# Patient Record
Sex: Male | Born: 1995 | Race: White | Hispanic: No | Marital: Single | Smoking: Never smoker
Health system: Southern US, Community
[De-identification: ages and names within clinical notes are randomized; demographics above are authoritative.]

## PROBLEM LIST (undated history)

## (undated) HISTORY — PX: APPENDECTOMY: SHX54

---
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
## 2016-05-14 IMAGING — DX DG ANKLE COMPLETE 3+V*R*
3 series · 3 of 3 positions shown · non-contrast
Comparison: None.

CLINICAL DATA: Hurt right foot and ankle while playing basketball
today. Initial encounter.

EXAM:
RIGHT ANKLE - COMPLETE 3+ VIEW

[ankle ap]
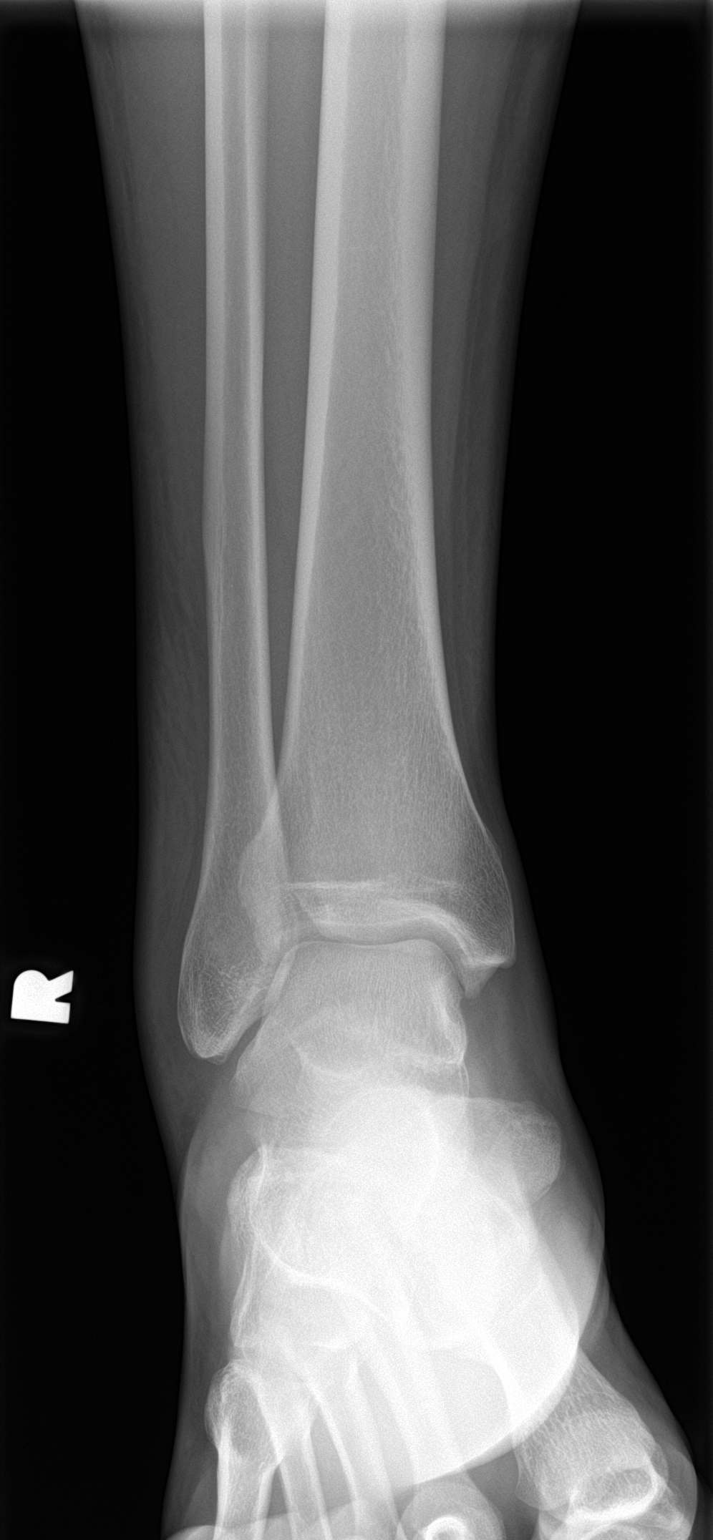

[ankle obl]
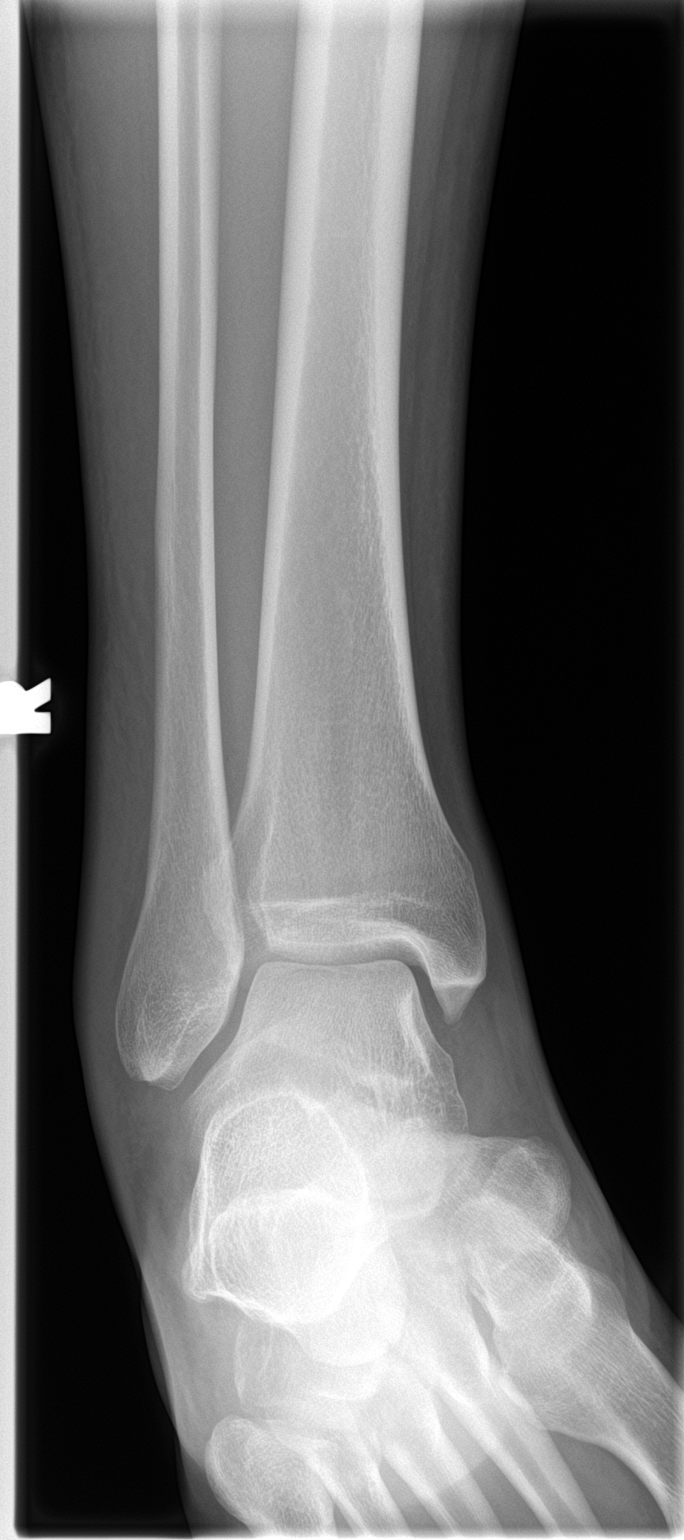

[ankle lat]
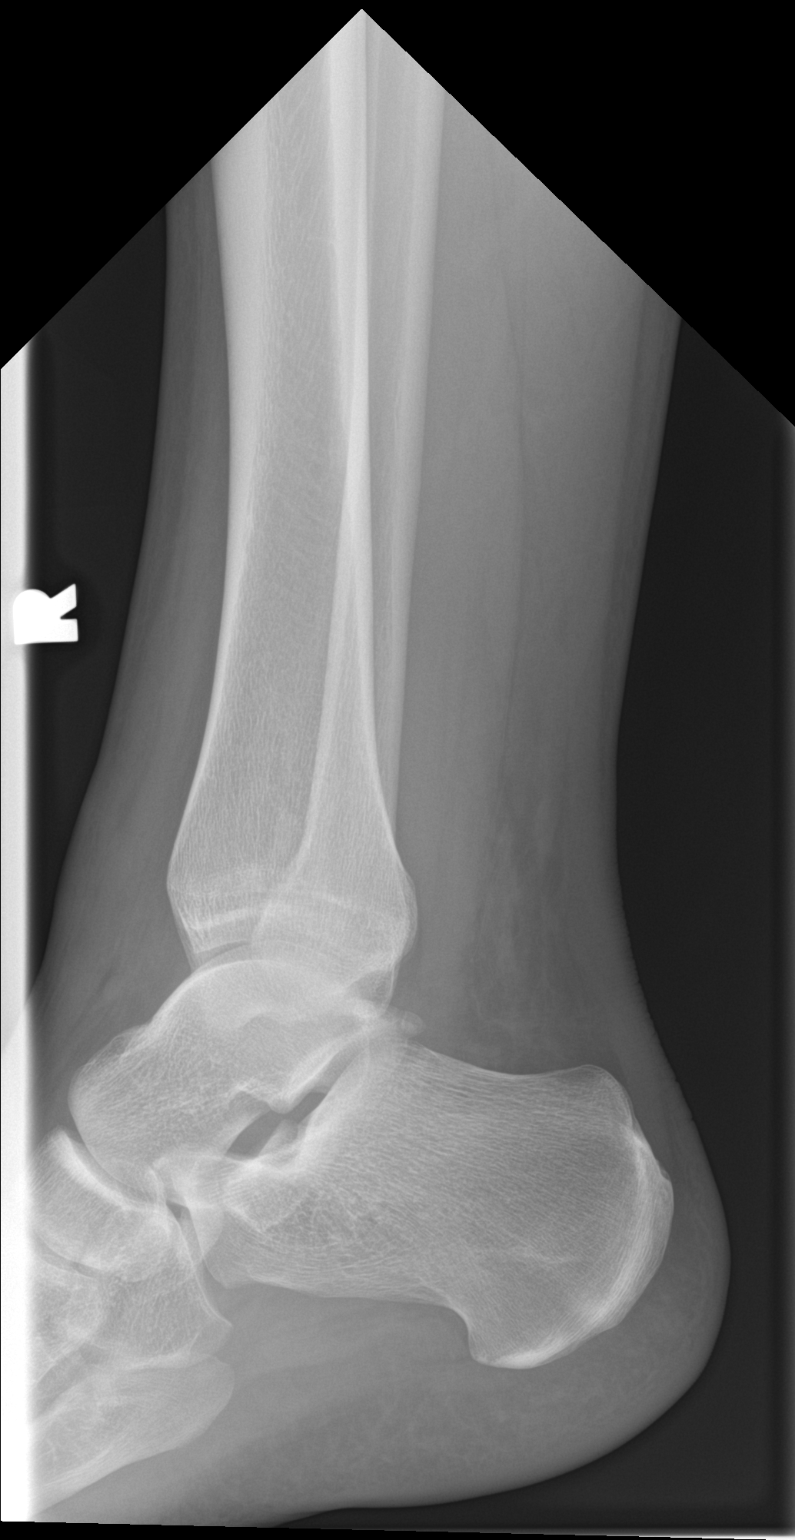

[3 of 3 positions shown; findings below may reference images not displayed]

FINDINGS: Soft tissue swelling about the lateral ankle. There is no evidence
of fracture, dislocation, or joint effusion.
IMPRESSION: Soft tissue swelling without bony abnormality.

## 2016-05-14 IMAGING — DX DG FOOT COMPLETE 3+V*R*
3 series · 3 of 3 positions shown · non-contrast
Comparison: None.

CLINICAL DATA: Hurt right foot playing basketball this afternoon.
Twisted the ankle.

EXAM:
RIGHT FOOT COMPLETE - 3+ VIEW

[foot ap]
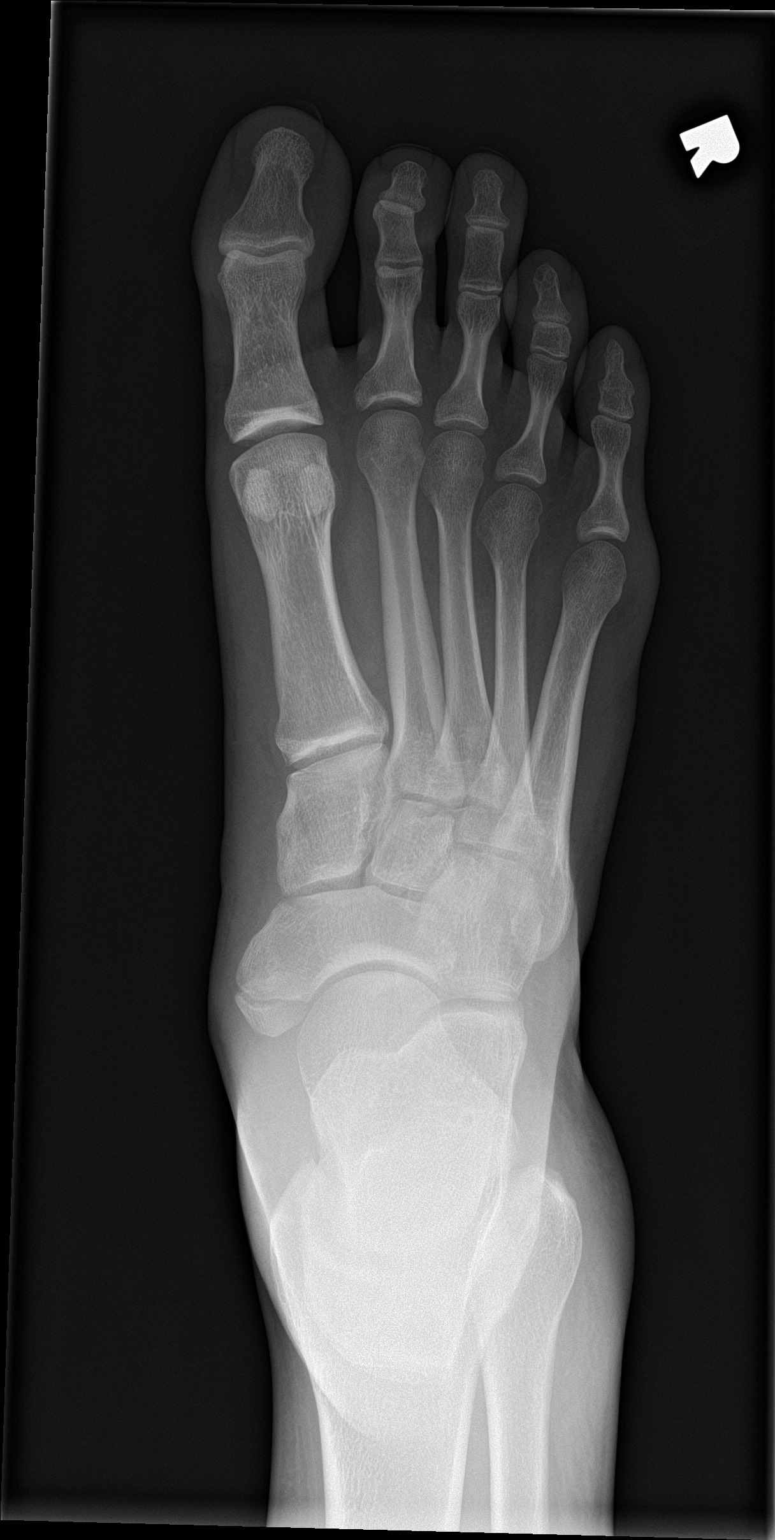

[foot obl]
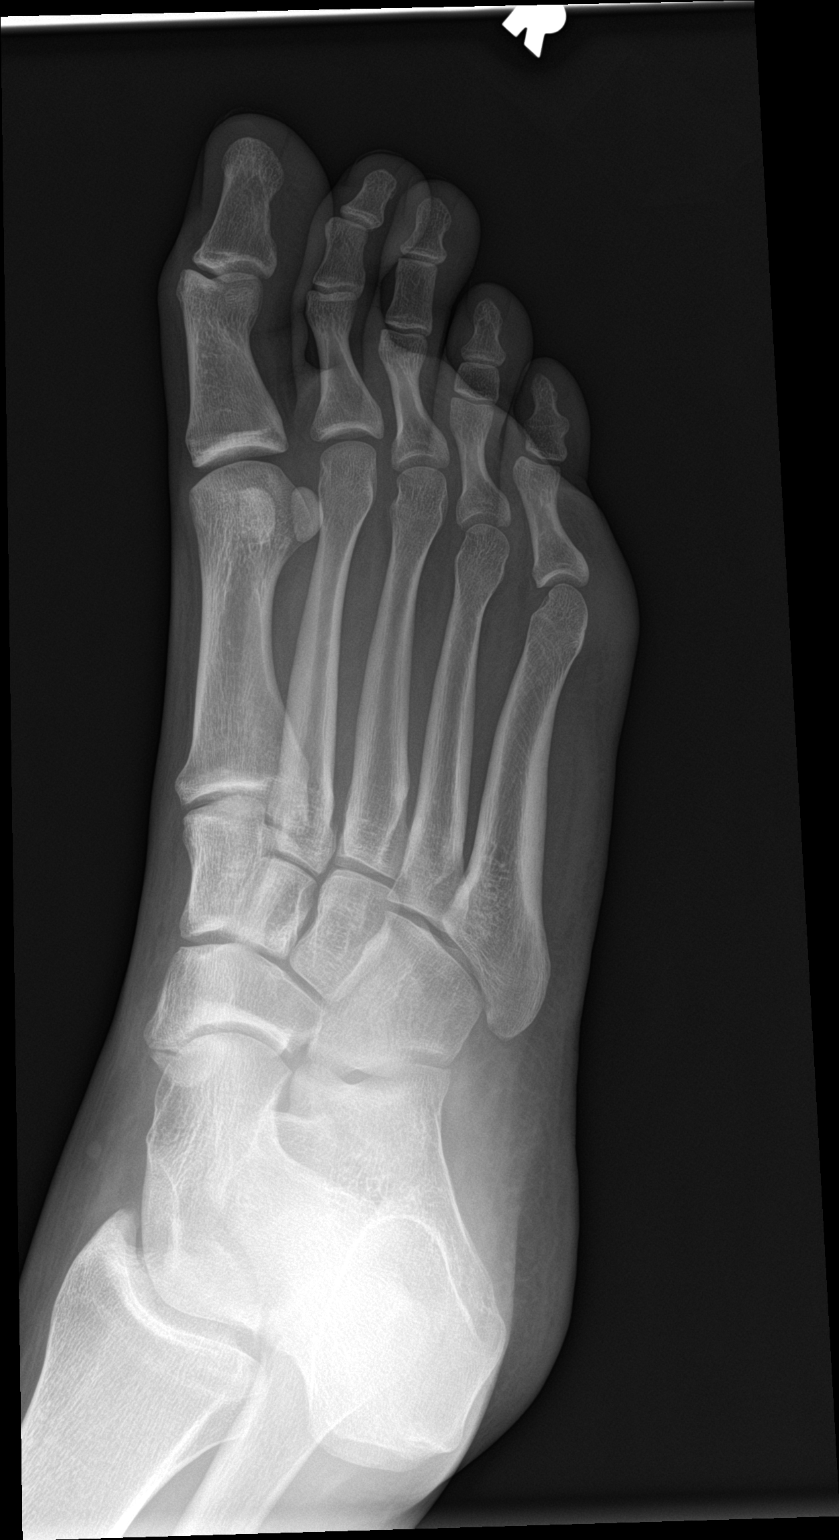

[foot lat]
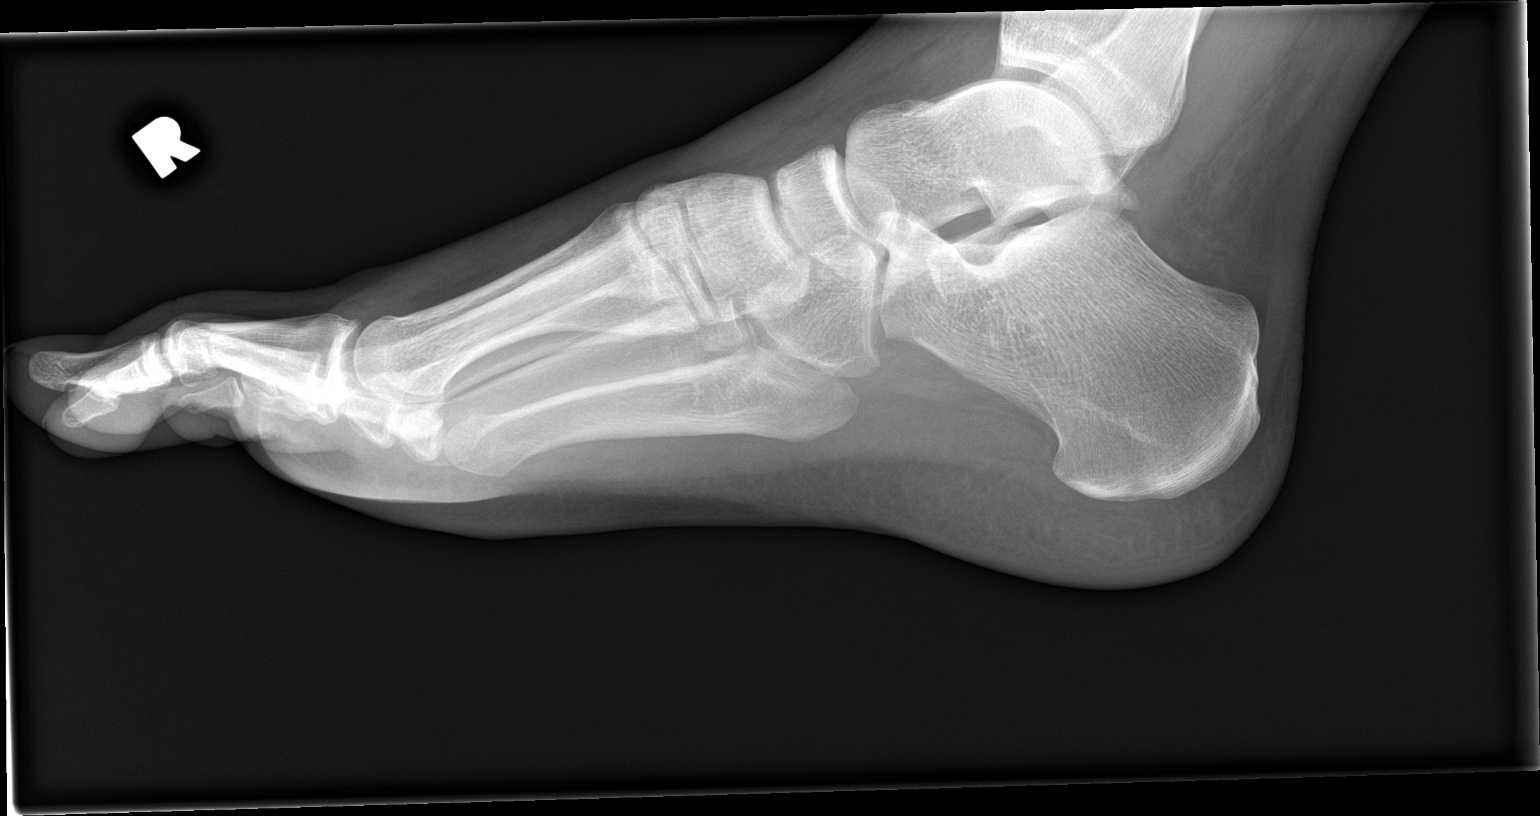

[3 of 3 positions shown; findings below may reference images not displayed]

FINDINGS: Soft tissue swelling about the lateral aspect of the right foot and
ankle. Old ununited ossicle over the navicular bone. No evidence of
acute fracture or subluxation. No focal bone lesion or bone
destruction. Bone cortex and trabecular architecture appear intact.
No radiopaque soft tissue foreign bodies.
IMPRESSION: Soft tissue swelling.  No acute bony abnormalities.

## 2018-03-31 ENCOUNTER — Ambulatory Visit: Payer: Medicaid Other | Admitting: Family Medicine

## 2018-04-05 ENCOUNTER — Ambulatory Visit: Payer: Medicaid Other | Admitting: Family Medicine

## 2018-07-19 ENCOUNTER — Ambulatory Visit: Payer: Medicaid Other | Admitting: Internal Medicine

## 2018-08-01 ENCOUNTER — Ambulatory Visit: Payer: Medicaid Other | Admitting: Internal Medicine

## 2018-08-08 ENCOUNTER — Ambulatory Visit: Payer: Medicaid Other | Admitting: Internal Medicine

## 2018-08-15 ENCOUNTER — Ambulatory Visit: Payer: Self-pay | Admitting: Internal Medicine

## 2019-06-05 ENCOUNTER — Ambulatory Visit (HOSPITAL_COMMUNITY)
Admission: EM | Admit: 2019-06-05 | Discharge: 2019-06-05 | Disposition: A | Discharge status: Home / Self Care | Payer: HRSA Program | Attending: Internal Medicine | Admitting: Internal Medicine

## 2019-06-05 ENCOUNTER — Other Ambulatory Visit: Payer: Self-pay

## 2019-06-05 ENCOUNTER — Encounter (HOSPITAL_COMMUNITY): Payer: Self-pay

## 2019-06-05 DIAGNOSIS — B349 Viral infection, unspecified: Secondary | ICD-10-CM

## 2019-06-05 DIAGNOSIS — Z20822 Contact with and (suspected) exposure to covid-19: Secondary | ICD-10-CM

## 2019-06-05 DIAGNOSIS — U071 COVID-19: Secondary | ICD-10-CM | POA: Diagnosis not present

## 2019-06-05 MED ORDER — BENZONATATE 100 MG PO CAPS: 100.0000 mg | ORAL_CAPSULE | Freq: Three times a day (TID) | ORAL | 0 refills | Status: DC

## 2019-06-05 MED ORDER — ONDANSETRON 4 MG PO TBDP
4.0000 mg | ORAL_TABLET | Freq: Once | ORAL | Status: AC
  Administered 2019-06-05: 17:00:00 4 mg via ORAL

## 2019-06-05 MED ORDER — ONDANSETRON HCL 4 MG PO TABS: 4.0000 mg | ORAL_TABLET | Freq: Three times a day (TID) | ORAL | 0 refills | Status: DC | PRN

## 2019-06-05 MED ORDER — ONDANSETRON 4 MG PO TBDP
ORAL_TABLET | ORAL | Status: AC
  Filled 2019-06-05: qty 1

## 2019-06-05 NOTE — ED Provider Notes (Signed)
[facility]    CSN: [id number] Arrival date & time: 06/05/19  1525      History   Chief Complaint Chief Complaint  Patient presents with  . Abdominal Pain  . Nausea  . Cough    HPI Darren Ray is a 24 y.o. male.   Patient reports to urgent care for valuation of nausea, abdominal pain, cough and congestion.  He reports nasal congestion and cough started 5 days ago.  He reports cough has been minor with 2-3 coughs a day mostly to try to clear congestion.  His nasal congestion has not gotten significantly worse.  He does report that 2 days after symptoms started he lost sense of taste and smell.  He also reports he has had cramping abdominal pain with 5-10 episodes of loose stool daily.  Cramping is intermittent.  The cramping is generalized.  He has had nausea but no vomiting.  He reports minimal food intake due to being concerned about vomiting.  However he has consume liquids without issue.  He does note that stools were dark-colored however he reports taking Pepto-Bismol prior to the stools being dark.  He reports stools are brown initially in his sickness.  He took a dose of Pepto-Bismol this morning.  He denies seeing blood in his stool previously.  Denies sore throat or headache.  He has had fairly significant fatigue.  Denies fever or chills.  He does report that he generally felt a little better today.  However given all of his symptoms he wanted to come in for evaluation.     History reviewed. No pertinent past medical history.  There are no problems to display for this patient.   Past Surgical History:  Procedure Laterality Date  . APPENDECTOMY         Home Medications    Prior to Admission medications   Medication Sig Start Date End Date Taking? Authorizing Provider  benzonatate (TESSALON) 100 MG capsule Take 1 capsule (100 mg total) by mouth every 8 (eight) hours. 06/05/19   Trany Chernick, Veryl Speak, PA-C  HYDROcodone-acetaminophen (NORCO/VICODIN) 5-325 MG  per tablet 1 to 2 tabs every 4 to 6 hours as needed for pain. 01/30/13   Reuben Likes, MD  ibuprofen (ADVIL,MOTRIN) 800 MG tablet Take 1 tablet (800 mg total) by mouth 3 (three) times daily. 06/10/14   Ivery Quale, PA-C  meloxicam (MOBIC) 15 MG tablet Take 1 tablet (15 mg total) by mouth daily. 01/30/13   Reuben Likes, MD  ondansetron (ZOFRAN) 4 MG tablet Take 1 tablet (4 mg total) by mouth every 8 (eight) hours as needed for nausea or vomiting. 06/05/19   Adriana Quinby, Veryl Speak, PA-C    Family History Family History  Problem Relation Age of Onset  . Healthy Mother   . Hypertension Father     Social History Social History   Tobacco Use  . Smoking status: Never Smoker  Substance Use Topics  . Alcohol use: No  . Drug use: No     Allergies   Patient has no known allergies.   Review of Systems Review of Systems  Constitutional: Positive for fatigue. Negative for chills and fever.  HENT: Positive for congestion, postnasal drip, rhinorrhea and sinus pressure. Negative for ear pain, sinus pain and sore throat.   Eyes: Negative for pain and visual disturbance.  Respiratory: Negative for cough and shortness of breath.   Cardiovascular: Negative for chest pain and palpitations.  Gastrointestinal: Positive for abdominal pain, diarrhea and nausea. Negative  for blood in stool, constipation and vomiting.  Genitourinary: Negative for dysuria and hematuria.  Musculoskeletal: Negative for arthralgias, back pain and myalgias.  Skin: Negative for color change and rash.  Neurological: Negative for seizures, syncope and headaches.  All other systems reviewed and are negative.    Physical Exam Triage Vital Signs ED Triage Vitals  Enc Vitals Group     BP 06/05/19 1626 127/83     Pulse Rate 06/05/19 1626 95     Resp 06/05/19 1626 18     Temp 06/05/19 1626 98.9 F (37.2 C)     Temp Source 06/05/19 1626 Oral     SpO2 06/05/19 1626 97 %     Weight --      Height --      Head Circumference  --      Peak Flow --      Pain Score 06/05/19 1623 9     Pain Loc --      Pain Edu? --      Excl. in [place]? --    No data found.  Updated Vital Signs BP 127/83 (BP Location: Left Arm)   Pulse 95   Temp 98.9 F (37.2 C) (Oral)   Resp 18   SpO2 97%   Visual Acuity Right Eye Distance:   Left Eye Distance:   Bilateral Distance:    Right Eye Near:   Left Eye Near:    Bilateral Near:     Physical Exam Vitals and nursing note reviewed.  Constitutional:      General: He is not in acute distress.    Appearance: He is well-developed. He is ill-appearing.  HENT:     Head: Normocephalic and atraumatic.     Nose:     Right Turbinates: Enlarged and swollen.     Left Turbinates: Enlarged and swollen.     Mouth/Throat:     Mouth: Mucous membranes are moist.     Comments: Postnasal drip visible Eyes:     Extraocular Movements: Extraocular movements intact.     Conjunctiva/sclera: Conjunctivae normal.     Pupils: Pupils are equal, round, and reactive to light.  Cardiovascular:     Rate and Rhythm: Normal rate and regular rhythm.     Heart sounds: No murmur.  Pulmonary:     Effort: Pulmonary effort is normal. No respiratory distress.     Breath sounds: Normal breath sounds.  Abdominal:     General: Abdomen is flat. Bowel sounds are normal.     Palpations: Abdomen is soft. There is no hepatomegaly or splenomegaly.     Tenderness: There is no abdominal tenderness. There is no right CVA tenderness or left CVA tenderness.     Hernia: No hernia is present.  Musculoskeletal:     Cervical back: Neck supple.  Skin:    General: Skin is warm and dry.  Neurological:     General: No focal deficit present.     Mental Status: He is alert and oriented to person, place, and time.      UC Treatments / Results  Labs (all labs ordered are listed, but only abnormal results are displayed) Labs Reviewed  SARS CORONAVIRUS 2 (TAT 6-24 HRS)    EKG   Radiology No results found.   Procedures Procedures (including critical care time)  Medications Ordered in UC Medications  ondansetron (ZOFRAN-ODT) disintegrating tablet 4 mg (4 mg Oral Given 06/05/19 1632)    Initial Impression / Assessment and Plan / UC Course  I  have reviewed the triage vital signs and the nursing notes.  Pertinent labs & imaging results that were available during my care of the patient were reviewed by me and considered in my medical decision making (see chart for details).     #Viral illness Patient is a 24 year old presenting with symptoms consistent with COVID-19 infection.  Covid PCR was sent.  He has been tolerating fluids despite nausea and is encouraging vital signs all within normal limits.  Do believe that dark-colored stools are secondary to Pepto-Bismol.  Instructed patient to stop Pepto-Bismol to evaluate whether stools returned to brown color.  Abdominal exam is benign.  We will treat symptomatically with Zofran.  Tessalon for cough and Tylenol for body ache. -Encouraged p.o. intake -Instructed patient to follow-up if continued dark stools. -Emergency department precautions for severe abdominal pain or intractable nausea. -Patient verbalized understanding -Gust consideration of beginning Imodium if stool returns to brown color in 1 to 2 days. Final Clinical Impressions(s) / UC Diagnoses   Final diagnoses:  Viral illness  Encounter for laboratory testing for COVID-19 virus     Discharge Instructions     I want you to stop the Pepto-Bismol. Take Zofran for nausea and consume small meals and continue to drink plenty of water and liquids Take the Tessalon for your cough Take Tylenol for body ache fever and possible sore throat.  If your diarrhea is persisting tomorrow and it is return to a brown color want you to begin using the Imodium.  If it continues to be black for several days I want you to return to clinic or follow-up with primary care  We have that you will be  generally improving over the next 3 to 5 days.  If you are unable to tolerate liquids for more than 24 hours he should report to the emergency department.  Your abdominal pain becomes very severe and persistent he should go to the emergency department  If your Covid-19 test is positive, you will receive a phone call from [facility] regarding your results. Negative test results are not called. Both positive and negative results area always visible on MyChart. If you do not have a MyChart account, sign up instructions are in your discharge papers.   Persons who are directed to care for themselves at home may discontinue isolation under the following conditions:  . At least 10 days have passed since symptom onset and . At least 24 hours have passed without running a fever (this means without the use of fever-reducing medications) and . Other symptoms have improved.  Persons infected with COVID-19 who never develop symptoms may discontinue isolation and other precautions 10 days after the date of their first positive COVID-19 test.      ED Prescriptions    Medication Sig Dispense Auth. Provider   ondansetron (ZOFRAN) 4 MG tablet Take 1 tablet (4 mg total) by mouth every 8 (eight) hours as needed for nausea or vomiting. 12 tablet Ashton Sabine, Veryl Speak, PA-C   benzonatate (TESSALON) 100 MG capsule Take 1 capsule (100 mg total) by mouth every 8 (eight) hours. 21 capsule Ailee Pates, Veryl Speak, PA-C     PDMP not reviewed this encounter.   Hermelinda Medicus, PA-C 06/05/19 [phone]

## 2019-06-05 NOTE — ED Triage Notes (Addendum)
Pt states onset of infrequent cough, persistent abdom pain, n/d, sinus pressure since last Thursday with decreased energy. Has been using OTC cough/cold medication and pepto bismol for symptoms.  States he lost sense of taste/smell on Saturday.

## 2019-06-05 NOTE — Discharge Instructions (Signed)
I want you to stop the Pepto-Bismol. Take Zofran for nausea and consume small meals and continue to drink plenty of water and liquids Take the Tessalon for your cough Take Tylenol for body ache fever and possible sore throat.  If your diarrhea is persisting tomorrow and it is return to a brown color want you to begin using the Imodium.  If it continues to be black for several days I want you to return to clinic or follow-up with primary care  We have that you will be generally improving over the next 3 to 5 days.  If you are unable to tolerate liquids for more than 24 hours he should report to the emergency department.  Your abdominal pain becomes very severe and persistent he should go to the emergency department  If your Covid-19 test is positive, you will receive a phone call from [facility] regarding your results. Negative test results are not called. Both positive and negative results area always visible on MyChart. If you do not have a MyChart account, sign up instructions are in your discharge papers.   Persons who are directed to care for themselves at home may discontinue isolation under the following conditions:   At least 10 days have passed since symptom onset and  At least 24 hours have passed without running a fever (this means without the use of fever-reducing medications) and  Other symptoms have improved.  Persons infected with COVID-19 who never develop symptoms may discontinue isolation and other precautions 10 days after the date of their first positive COVID-19 test.

## 2019-06-06 LAB — SARS CORONAVIRUS 2 (TAT 6-24 HRS): SARS Coronavirus 2: POSITIVE — AB

## 2019-07-16 ENCOUNTER — Ambulatory Visit (HOSPITAL_COMMUNITY)
Admission: EM | Admit: 2019-07-16 | Discharge: 2019-07-16 | Disposition: A | Discharge status: Home / Self Care | Payer: BLUE CROSS/BLUE SHIELD | Attending: Family Medicine | Admitting: Family Medicine

## 2019-07-16 ENCOUNTER — Encounter (HOSPITAL_COMMUNITY): Payer: Self-pay

## 2019-07-16 ENCOUNTER — Other Ambulatory Visit: Payer: Self-pay

## 2019-07-16 DIAGNOSIS — K0889 Other specified disorders of teeth and supporting structures: Secondary | ICD-10-CM | POA: Diagnosis not present

## 2019-07-16 MED ORDER — TRAMADOL HCL 50 MG PO TABS: 50.0000 mg | ORAL_TABLET | Freq: Two times a day (BID) | ORAL | 0 refills | Status: AC | PRN

## 2019-07-16 MED ORDER — IBUPROFEN 800 MG PO TABS: 800.0000 mg | ORAL_TABLET | Freq: Three times a day (TID) | ORAL | 0 refills | Status: DC

## 2019-07-16 MED ORDER — AMOXICILLIN-POT CLAVULANATE 875-125 MG PO TABS: 1.0000 | ORAL_TABLET | Freq: Two times a day (BID) | ORAL | 0 refills | Status: DC

## 2019-07-16 NOTE — ED Provider Notes (Signed)
[place]    CSN: [id number] Arrival date & time: 07/16/19  1112      History   Chief Complaint Chief Complaint  Patient presents with  . Dental Pain    HPI Darren Ray is a 24 y.o. male.   Patient is a 24 year old male who presents today with dental pain.  Pain is located to the upper and lower left.  Pain worsened after playing basketball and got hit in the mouth leading to chipping the bottom and top tooth.  He already has some dental caries in this area.  He has been since having pain for the past 4 to 5 days and taking ibuprofen without much relief.  He is having trouble eating due to the pain.  Has an appointment to see a dentist in 3 weeks.  Mild facial swelling that has been coming and going.  No fevers, chills or trismus.  ROS per HPI      History reviewed. No pertinent past medical history.  There are no problems to display for this patient.   Past Surgical History:  Procedure Laterality Date  . APPENDECTOMY         Home Medications    Prior to Admission medications   Medication Sig Start Date End Date Taking? Authorizing Provider  amoxicillin-clavulanate (AUGMENTIN) 875-125 MG tablet Take 1 tablet by mouth every 12 (twelve) hours. 07/16/19   Loura Halt A, NP  ibuprofen (ADVIL) 800 MG tablet Take 1 tablet (800 mg total) by mouth 3 (three) times daily. 07/16/19   Loura Halt A, NP  traMADol (ULTRAM) 50 MG tablet Take 1 tablet (50 mg total) by mouth every 12 (twelve) hours as needed for up to 3 days. 07/16/19 07/19/19  Orvan July, NP    Family History Family History  Problem Relation Age of Onset  . Healthy Mother   . Hypertension Father     Social History Social History   Tobacco Use  . Smoking status: Never Smoker  Substance Use Topics  . Alcohol use: No  . Drug use: No     Allergies   Patient has no known allergies.   Review of Systems Review of Systems   Physical Exam Triage Vital Signs ED Triage Vitals  Enc  Vitals Group     BP 07/16/19 1153 118/74     Pulse Rate 07/16/19 1153 88     Resp 07/16/19 1153 18     Temp 07/16/19 1153 98 F (36.7 C)     Temp src --      SpO2 07/16/19 1153 99 %     Weight --      Height --      Head Circumference --      Peak Flow --      Pain Score 07/16/19 1149 10     Pain Loc --      Pain Edu? --      Excl. in [place]? --    No data found.  Updated Vital Signs BP 118/74 (BP Location: Right Arm)   Pulse 88   Temp 98 F (36.7 C)   Resp 18   SpO2 99%   Visual Acuity Right Eye Distance:   Left Eye Distance:   Bilateral Distance:    Right Eye Near:   Left Eye Near:    Bilateral Near:     Physical Exam Vitals and nursing note reviewed.  Constitutional:      Appearance: Normal appearance.  HENT:  Head: Normocephalic and atraumatic.     Nose: Nose normal.     Mouth/Throat:     Dentition: Abnormal dentition. Dental tenderness, gingival swelling and dental caries present. No dental abscesses.   Eyes:     Conjunctiva/sclera: Conjunctivae normal.  Pulmonary:     Effort: Pulmonary effort is normal.  Musculoskeletal:        General: Normal range of motion.     Cervical back: Normal range of motion.  Skin:    General: Skin is warm and dry.  Neurological:     Mental Status: He is alert.  Psychiatric:        Mood and Affect: Mood normal.      UC Treatments / Results  Labs (all labs ordered are listed, but only abnormal results are displayed) Labs Reviewed - No data to display  EKG   Radiology No results found.  Procedures Procedures (including critical care time)  Medications Ordered in UC Medications - No data to display  Initial Impression / Assessment and Plan / UC Course  I have reviewed the triage vital signs and the nursing notes.  Pertinent labs & imaging results that were available during my care of the patient were reviewed by me and considered in my medical decision making (see chart for details).     Dental pain  most likely due to dental fracture and infection. Treating with Augmentin Ibuprofen 800 mg for pain, inflammation and swelling.  Tramadol as needed for severe pain. Follow-up with dentist in 3 weeks as planned Final Clinical Impressions(s) / UC Diagnoses   Final diagnoses:  Pain, dental     Discharge Instructions     Treating you for dental infection.  Take the antibiotics as prescribed 800 milligrams ibuprofen for pain and tramadol for severe pain. Follow-up with dentist as planned    ED Prescriptions    Medication Sig Dispense Auth. Provider   ibuprofen (ADVIL) 800 MG tablet Take 1 tablet (800 mg total) by mouth 3 (three) times daily. 21 tablet Marzetta Lanza A, NP   traMADol (ULTRAM) 50 MG tablet Take 1 tablet (50 mg total) by mouth every 12 (twelve) hours as needed for up to 3 days. 6 tablet Eugen Jeansonne A, NP   amoxicillin-clavulanate (AUGMENTIN) 875-125 MG tablet Take 1 tablet by mouth every 12 (twelve) hours. 14 tablet Bocephus Cali A, NP     I have reviewed the PDMP during this encounter.   Dahlia Byes A, NP 07/16/19 1301

## 2019-07-16 NOTE — Discharge Instructions (Signed)
Treating you for dental infection.  Take the antibiotics as prescribed 800 milligrams ibuprofen for pain and tramadol for severe pain. Follow-up with dentist as planned

## 2019-07-16 NOTE — ED Triage Notes (Signed)
Pt c/o dental pain lower left molar. Pt reports that tooth was already painful, then was struck in mouth while playing basektball and tooth broke off; now with increase pain/sensitivity.  States Tmax of 100.1 past couple days.  Took 2 tylenol this morning approx 0800.

## 2019-12-17 ENCOUNTER — Encounter (HOSPITAL_COMMUNITY): Payer: Self-pay

## 2019-12-17 ENCOUNTER — Other Ambulatory Visit: Payer: Self-pay

## 2019-12-17 ENCOUNTER — Ambulatory Visit (HOSPITAL_COMMUNITY)
Admission: EM | Admit: 2019-12-17 | Discharge: 2019-12-17 | Disposition: A | Discharge status: Home / Self Care | Payer: BLUE CROSS/BLUE SHIELD | Attending: Internal Medicine | Admitting: Internal Medicine

## 2019-12-17 DIAGNOSIS — K047 Periapical abscess without sinus: Secondary | ICD-10-CM

## 2019-12-17 MED ORDER — HYDROCODONE-ACETAMINOPHEN 5-325 MG PO TABS: 1.0000 | ORAL_TABLET | Freq: Four times a day (QID) | ORAL | 0 refills | Status: DC | PRN

## 2019-12-17 MED ORDER — IBUPROFEN 600 MG PO TABS: 600.0000 mg | ORAL_TABLET | Freq: Three times a day (TID) | ORAL | 0 refills | Status: AC

## 2019-12-17 MED ORDER — AMOXICILLIN-POT CLAVULANATE 875-125 MG PO TABS: 1.0000 | ORAL_TABLET | Freq: Two times a day (BID) | ORAL | 0 refills | Status: DC

## 2019-12-17 MED ORDER — KETOROLAC TROMETHAMINE 30 MG/ML IJ SOLN: 30.0000 mg | Freq: Once | INTRAMUSCULAR | Status: DC

## 2019-12-17 NOTE — Discharge Instructions (Signed)
Please use Sensodyne toothpaste to augment pain management regimen Keep your dental appointment scheduled for next Monday

## 2019-12-17 NOTE — ED Triage Notes (Signed)
Pt presents with left side dental pain X 2 weeks.  

## 2019-12-19 NOTE — ED Provider Notes (Signed)
[facility]    CSN: [id number] Arrival date & time: 12/17/19  1729      History   Chief Complaint Chief Complaint  Patient presents with  . Dental Pain    HPI Darren Ray is a 24 y.o. male with a history of poor dental hygiene comes to the urgent care with complaints of oral pain of 2 weeks duration.  Patient has cracked tooth and started experiencing pain about 2 weeks ago.  Pain started insidiously and has been worsening.  Is tried over-the-counter remedies with no improvement in pain.  Aggravated by chewing.  Not associated with any jaw swelling.   HPI  History reviewed. No pertinent past medical history.  There are no problems to display for this patient.   Past Surgical History:  Procedure Laterality Date  . APPENDECTOMY         Home Medications    Prior to Admission medications   Medication Sig Start Date End Date Taking? Authorizing Provider  amoxicillin-clavulanate (AUGMENTIN) 875-125 MG tablet Take 1 tablet by mouth every 12 (twelve) hours. 12/17/19   Providence Stivers, Britta Mccreedy, MD  HYDROcodone-acetaminophen (NORCO/VICODIN) 5-325 MG tablet Take 1 tablet by mouth every 6 (six) hours as needed for moderate pain or severe pain. 12/17/19   Kanyia Heaslip, Britta Mccreedy, MD  ibuprofen (ADVIL) 600 MG tablet Take 1 tablet (600 mg total) by mouth 3 (three) times daily. 12/17/19   LampteyBritta Mccreedy, MD    Family History Family History  Problem Relation Age of Onset  . Healthy Mother   . Hypertension Father     Social History Social History   Tobacco Use  . Smoking status: Never Smoker  Substance Use Topics  . Alcohol use: No  . Drug use: No     Allergies   Patient has no known allergies.   Review of Systems Review of Systems  Constitutional: Negative.   HENT: Positive for dental problem. Negative for facial swelling, postnasal drip, rhinorrhea, sinus pressure and sinus pain.   Eyes: Negative.   Respiratory: Negative.   Gastrointestinal: Negative.       Physical Exam Triage Vital Signs ED Triage Vitals  Enc Vitals Group     BP 12/17/19 1938 (!) 141/84     Pulse Rate 12/17/19 1938 90     Resp 12/17/19 1938 17     Temp 12/17/19 1938 98.8 F (37.1 C)     Temp Source 12/17/19 1938 Oral     SpO2 12/17/19 1938 98 %     Weight --      Height --      Head Circumference --      Peak Flow --      Pain Score 12/17/19 1937 10     Pain Loc --      Pain Edu? --      Excl. in [place]? --    No data found.  Updated Vital Signs BP (!) 141/84 (BP Location: Right Arm)   Pulse 90   Temp 98.8 F (37.1 C) (Oral)   Resp 17   SpO2 98%   Visual Acuity Right Eye Distance:   Left Eye Distance:   Bilateral Distance:    Right Eye Near:   Left Eye Near:    Bilateral Near:     Physical Exam Vitals and nursing note reviewed.  Constitutional:      General: He is in acute distress.  HENT:     Right Ear: Tympanic membrane normal.     Left  Ear: Tympanic membrane normal.     Nose: No congestion or rhinorrhea.     Mouth/Throat:     Comments: Left 2nd mandibular molar tooth cavity. Pulmonary:     Effort: Pulmonary effort is normal.     Breath sounds: Normal breath sounds.  Skin:    Capillary Refill: Capillary refill takes less than 2 seconds.  Neurological:     Mental Status: He is alert.      UC Treatments / Results  Labs (all labs ordered are listed, but only abnormal results are displayed) Labs Reviewed - No data to display  EKG   Radiology No results found.  Procedures Procedures (including critical care time)  Medications Ordered in UC Medications - No data to display  Initial Impression / Assessment and Plan / UC Course  I have reviewed the triage vital signs and the nursing notes.  Pertinent labs & imaging results that were available during my care of the patient were reviewed by me and considered in my medical decision making (see chart for details).     1.  Dental infection: Augmentin 875-125 mg twice daily  for 7 days Hydrocodone-acetaminophen as needed for pain Ibuprofen as needed for pain Patient is advised to keep his appointment on Monday for dental work Return precautions given. Final Clinical Impressions(s) / UC Diagnoses   Final diagnoses:  Dental infection     Discharge Instructions     Please use Sensodyne toothpaste to augment pain management regimen Keep your dental appointment scheduled for next Monday    ED Prescriptions    Medication Sig Dispense Auth. Provider   amoxicillin-clavulanate (AUGMENTIN) 875-125 MG tablet Take 1 tablet by mouth every 12 (twelve) hours. 14 tablet Audreanna Torrisi, Britta Mccreedy, MD   HYDROcodone-acetaminophen (NORCO/VICODIN) 5-325 MG tablet Take 1 tablet by mouth every 6 (six) hours as needed for moderate pain or severe pain. 12 tablet Arran Fessel, Britta Mccreedy, MD   ibuprofen (ADVIL) 600 MG tablet Take 1 tablet (600 mg total) by mouth 3 (three) times daily. 30 tablet Trana Ressler, Britta Mccreedy, MD     I have reviewed the PDMP during this encounter.   Merrilee Jansky, MD 12/19/19 1339

## 2021-06-02 ENCOUNTER — Encounter (HOSPITAL_COMMUNITY): Payer: Self-pay | Admitting: Emergency Medicine

## 2021-06-02 ENCOUNTER — Ambulatory Visit (HOSPITAL_COMMUNITY)
Admission: EM | Admit: 2021-06-02 | Discharge: 2021-06-02 | Disposition: A | Discharge status: Home / Self Care | Payer: 59 | Attending: Emergency Medicine | Admitting: Emergency Medicine

## 2021-06-02 ENCOUNTER — Telehealth (HOSPITAL_COMMUNITY): Payer: Self-pay | Admitting: Emergency Medicine

## 2021-06-02 DIAGNOSIS — K047 Periapical abscess without sinus: Secondary | ICD-10-CM

## 2021-06-02 DIAGNOSIS — K0889 Other specified disorders of teeth and supporting structures: Secondary | ICD-10-CM | POA: Diagnosis not present

## 2021-06-02 MED ORDER — OXYCODONE HCL 5 MG PO TABS: 5.0000 mg | ORAL_TABLET | Freq: Four times a day (QID) | ORAL | 0 refills | Status: AC | PRN

## 2021-06-02 MED ORDER — HYDROCODONE-ACETAMINOPHEN 5-325 MG PO TABS: 1.0000 | ORAL_TABLET | Freq: Four times a day (QID) | ORAL | 0 refills | Status: DC | PRN

## 2021-06-02 MED ORDER — OXYCODONE HCL 5 MG PO TABS: 5.0000 mg | ORAL_TABLET | Freq: Four times a day (QID) | ORAL | 0 refills | Status: DC | PRN

## 2021-06-02 MED ORDER — LIDOCAINE VISCOUS HCL 2 % MT SOLN: 15.0000 mL | OROMUCOSAL | 0 refills | Status: AC | PRN

## 2021-06-02 MED ORDER — AMOXICILLIN-POT CLAVULANATE 875-125 MG PO TABS: 1.0000 | ORAL_TABLET | Freq: Two times a day (BID) | ORAL | 0 refills | Status: AC

## 2021-06-02 NOTE — Discharge Instructions (Addendum)
Begin use of Augmentin, take twice daily for 7 days, this medication helps get rid of bacteria that may be causing infection ? ?You may use lidocaine, gargle and spit every 4 hours as needed to give a temporary numbing effect to your mouth ? ?You may use Norco every 6 hours for severe pain, please attempt use of ibuprofen or Tylenol before using as you have only been dispensed 12 tablets ? ?Please continue to make efforts to get an appointment with a dentist  ?

## 2021-06-02 NOTE — Telephone Encounter (Signed)
Notified by patient that pharmacy has medicine on back order, prescription sent to a different pharmacy of patient's choosing ?

## 2021-06-02 NOTE — ED Triage Notes (Signed)
Pt reports having dental pain for 1-2 weeks. Reports that for 3 days having right facial swelling. Reports that has a leaking abscess.  ?

## 2021-06-02 NOTE — ED Provider Notes (Signed)
?[facility] ? ? ? ?CSN: [id number] ?Arrival date & time: 06/02/21  1155 ? ? ?  ? ?History   ?Chief Complaint ?Chief Complaint  ?Patient presents with  ? Dental Pain  ? ? ?HPI ?Darren Ray is a 26 y.o. male.  ? ?Patient presents with abscess to the right upper gumline.  Endorses that he has had dental pain for 3 weeks.  Began to have right-sided facial swelling for 2 days with associated drainage .  Has been using ibuprofen which has been somewhat helpful.  Has been attempting to establish care with a dentist but unable to be seen due to the dental coverage .  Denies fever or chills. ? ?History reviewed. No pertinent past medical history. ? ?There are no problems to display for this patient. ? ? ?Past Surgical History:  ?Procedure Laterality Date  ? APPENDECTOMY    ? ? ? ? ? ?Home Medications   ? ?Prior to Admission medications   ?Medication Sig Start Date End Date Taking? Authorizing Provider  ?amoxicillin-clavulanate (AUGMENTIN) 875-125 MG tablet Take 1 tablet by mouth every 12 (twelve) hours. 12/17/19   LampteyMyrene Galas, MD  ?HYDROcodone-acetaminophen (NORCO/VICODIN) 5-325 MG tablet Take 1 tablet by mouth every 6 (six) hours as needed for moderate pain or severe pain. 12/17/19   LampteyMyrene Galas, MD  ?ibuprofen (ADVIL) 600 MG tablet Take 1 tablet (600 mg total) by mouth 3 (three) times daily. 12/17/19   Lamptey, Myrene Galas, MD  ? ? ?Family History ?Family History  ?Problem Relation Age of Onset  ? Healthy Mother   ? Hypertension Father   ? ? ?Social History ?Social History  ? ?Tobacco Use  ? Smoking status: Never  ?Substance Use Topics  ? Alcohol use: No  ? Drug use: No  ? ? ? ?Allergies   ?Patient has no known allergies. ? ? ?Review of Systems ?Review of Systems  ?Constitutional: Negative.   ?HENT:  Positive for dental problem. Negative for congestion, drooling, ear discharge, ear pain, facial swelling, hearing loss, mouth sores, nosebleeds, postnasal drip, rhinorrhea, sinus pressure, sinus pain,  sneezing, sore throat, tinnitus, trouble swallowing and voice change.   ?Respiratory: Negative.    ?Cardiovascular: Negative.   ?Skin: Negative.   ? ? ?Physical Exam ?Triage Vital Signs ?ED Triage Vitals  ?Enc Vitals Group  ?   BP 06/02/21 1235 126/81  ?   Pulse Rate 06/02/21 1235 79  ?   Resp 06/02/21 1235 18  ?   Temp 06/02/21 1235 98.7 ?F (37.1 ?C)  ?   Temp Source 06/02/21 1235 Oral  ?   SpO2 06/02/21 1235 100 %  ?   Weight --   ?   Height --   ?   Head Circumference --   ?   Peak Flow --   ?   Pain Score 06/02/21 1234 10  ?   Pain Loc --   ?   Pain Edu? --   ?   Excl. in [place]? --   ? ?No data found. ? ?Updated Vital Signs ?BP 126/81 (BP Location: Left Arm)   Pulse 79   Temp 98.7 ?F (37.1 ?C) (Oral)   Resp 18   SpO2 100%  ? ?Visual Acuity ?Right Eye Distance:   ?Left Eye Distance:   ?Bilateral Distance:   ? ?Right Eye Near:   ?Left Eye Near:    ?Bilateral Near:    ? ?Physical Exam ?Constitutional:   ?   Appearance: Normal appearance.  ?HENT:  ?  Head: Normocephalic.  ?   Comments: Moderate swelling to the right cheek ?   Mouth/Throat:  ?   Comments: Moderate to severe swelling of the right upper gumline with abscess noted, nondraining at this time ?Eyes:  ?   Extraocular Movements: Extraocular movements intact.  ?Pulmonary:  ?   Effort: Pulmonary effort is normal.  ?Skin: ?   General: Skin is warm and dry.  ?Neurological:  ?   Mental Status: He is alert and oriented to person, place, and time. Mental status is at baseline.  ?Psychiatric:     ?   Mood and Affect: Mood normal.     ?   Behavior: Behavior normal.  ? ? ? ?UC Treatments / Results  ?Labs ?(all labs ordered are listed, but only abnormal results are displayed) ?Labs Reviewed - No data to display ? ?EKG ? ? ?Radiology ?No results found. ? ?Procedures ?Procedures (including critical care time) ? ?Medications Ordered in UC ?Medications - No data to display ? ?Initial Impression / Assessment and Plan / UC Course  ?I have reviewed the triage vital signs  and the nursing notes. ? ?Pertinent labs & imaging results that were available during my care of the patient were reviewed by me and considered in my medical decision making (see chart for details). ? ?Abscess ?Dental pain ? ?Augmentin 7-day course prescribed for management of the abscess, prescribed lidocaine viscous and Norco for management of pain, PDMP reviewed, low risk, 12 tablets to be dispensed, encourage patient to continue to reach out to dental offices for further evaluation and management ?Final Clinical Impressions(s) / UC Diagnoses  ? ?Final diagnoses:  ?None  ? ?Discharge Instructions   ?None ?  ? ?ED Prescriptions   ?None ?  ? ?PDMP not reviewed this encounter. ?  ?Hans Eden, NP ?06/02/21 1255 ? ?

## 2022-02-05 DIAGNOSIS — Z419 Encounter for procedure for purposes other than remedying health state, unspecified: Secondary | ICD-10-CM | POA: Diagnosis not present

## 2022-03-08 DIAGNOSIS — Z419 Encounter for procedure for purposes other than remedying health state, unspecified: Secondary | ICD-10-CM | POA: Diagnosis not present

## 2022-04-08 DIAGNOSIS — Z419 Encounter for procedure for purposes other than remedying health state, unspecified: Secondary | ICD-10-CM | POA: Diagnosis not present

## 2022-05-07 DIAGNOSIS — Z419 Encounter for procedure for purposes other than remedying health state, unspecified: Secondary | ICD-10-CM | POA: Diagnosis not present

## 2022-06-07 DIAGNOSIS — Z419 Encounter for procedure for purposes other than remedying health state, unspecified: Secondary | ICD-10-CM | POA: Diagnosis not present

## 2022-07-07 DIAGNOSIS — Z419 Encounter for procedure for purposes other than remedying health state, unspecified: Secondary | ICD-10-CM | POA: Diagnosis not present

## 2022-08-07 DIAGNOSIS — Z419 Encounter for procedure for purposes other than remedying health state, unspecified: Secondary | ICD-10-CM | POA: Diagnosis not present

## 2022-09-06 DIAGNOSIS — Z419 Encounter for procedure for purposes other than remedying health state, unspecified: Secondary | ICD-10-CM | POA: Diagnosis not present

## 2022-10-07 DIAGNOSIS — Z419 Encounter for procedure for purposes other than remedying health state, unspecified: Secondary | ICD-10-CM | POA: Diagnosis not present

## 2022-11-07 DIAGNOSIS — Z419 Encounter for procedure for purposes other than remedying health state, unspecified: Secondary | ICD-10-CM | POA: Diagnosis not present

## 2022-12-07 DIAGNOSIS — Z419 Encounter for procedure for purposes other than remedying health state, unspecified: Secondary | ICD-10-CM | POA: Diagnosis not present

## 2023-01-07 DIAGNOSIS — Z419 Encounter for procedure for purposes other than remedying health state, unspecified: Secondary | ICD-10-CM | POA: Diagnosis not present

## 2023-02-06 DIAGNOSIS — Z419 Encounter for procedure for purposes other than remedying health state, unspecified: Secondary | ICD-10-CM | POA: Diagnosis not present

## 2023-02-22 DIAGNOSIS — F411 Generalized anxiety disorder: Secondary | ICD-10-CM | POA: Diagnosis not present

## 2023-02-22 DIAGNOSIS — R002 Palpitations: Secondary | ICD-10-CM | POA: Diagnosis not present

## 2023-02-22 DIAGNOSIS — R000 Tachycardia, unspecified: Secondary | ICD-10-CM | POA: Diagnosis not present

## 2023-02-22 DIAGNOSIS — R079 Chest pain, unspecified: Secondary | ICD-10-CM | POA: Diagnosis not present

## 2023-02-22 DIAGNOSIS — R0602 Shortness of breath: Secondary | ICD-10-CM | POA: Diagnosis not present

## 2023-03-09 DIAGNOSIS — Z419 Encounter for procedure for purposes other than remedying health state, unspecified: Secondary | ICD-10-CM | POA: Diagnosis not present

## 2023-04-09 DIAGNOSIS — Z419 Encounter for procedure for purposes other than remedying health state, unspecified: Secondary | ICD-10-CM | POA: Diagnosis not present

## 2023-05-07 DIAGNOSIS — Z419 Encounter for procedure for purposes other than remedying health state, unspecified: Secondary | ICD-10-CM | POA: Diagnosis not present

## 2023-06-18 DIAGNOSIS — Z419 Encounter for procedure for purposes other than remedying health state, unspecified: Secondary | ICD-10-CM | POA: Diagnosis not present

## 2023-07-18 DIAGNOSIS — Z419 Encounter for procedure for purposes other than remedying health state, unspecified: Secondary | ICD-10-CM | POA: Diagnosis not present

## 2023-08-18 DIAGNOSIS — Z419 Encounter for procedure for purposes other than remedying health state, unspecified: Secondary | ICD-10-CM | POA: Diagnosis not present

## 2023-09-17 DIAGNOSIS — Z419 Encounter for procedure for purposes other than remedying health state, unspecified: Secondary | ICD-10-CM | POA: Diagnosis not present

## 2023-10-18 DIAGNOSIS — Z419 Encounter for procedure for purposes other than remedying health state, unspecified: Secondary | ICD-10-CM | POA: Diagnosis not present

## 2023-11-18 DIAGNOSIS — Z419 Encounter for procedure for purposes other than remedying health state, unspecified: Secondary | ICD-10-CM | POA: Diagnosis not present

## 2024-01-18 DIAGNOSIS — Z419 Encounter for procedure for purposes other than remedying health state, unspecified: Secondary | ICD-10-CM | POA: Diagnosis not present

## 2024-01-20 DIAGNOSIS — M5442 Lumbago with sciatica, left side: Secondary | ICD-10-CM | POA: Diagnosis not present
